# Patient Record
Sex: Female | Born: 1960 | Race: White | Hispanic: No | Marital: Married | Smoking: Never smoker
Health system: Southern US, Community
[De-identification: ages and names within clinical notes are randomized; demographics above are authoritative.]

## PROBLEM LIST (undated history)

## (undated) DIAGNOSIS — G43909 Migraine, unspecified, not intractable, without status migrainosus: Secondary | ICD-10-CM

## (undated) DIAGNOSIS — Z8619 Personal history of other infectious and parasitic diseases: Secondary | ICD-10-CM

## (undated) DIAGNOSIS — N951 Menopausal and female climacteric states: Secondary | ICD-10-CM

## (undated) HISTORY — PX: FOOT SURGERY: SHX648

## (undated) HISTORY — PX: BUNIONECTOMY: SHX129

## (undated) HISTORY — PX: REFRACTIVE SURGERY: SHX103

## (undated) HISTORY — PX: TRIGGER FINGER RELEASE: SHX641

---

## 2012-10-29 ENCOUNTER — Ambulatory Visit: Payer: Self-pay

## 2013-09-19 ENCOUNTER — Ambulatory Visit: Payer: Self-pay | Admitting: Family Medicine

## 2013-09-19 LAB — URINALYSIS, COMPLETE
BILIRUBIN, UR: NEGATIVE
Glucose,UR: NEGATIVE mg/dL (ref 0–75)
KETONE: NEGATIVE
Nitrite: NEGATIVE
Ph: 6 (ref 4.5–8.0)
SPECIFIC GRAVITY: 1.015 (ref 1.003–1.030)
SQUAMOUS EPITHELIAL: NONE SEEN

## 2013-09-21 LAB — URINE CULTURE

## 2013-11-11 ENCOUNTER — Ambulatory Visit: Payer: Self-pay | Admitting: Obstetrics and Gynecology

## 2014-06-26 ENCOUNTER — Ambulatory Visit
Admission: EM | Admit: 2014-06-26 | Discharge: 2014-06-26 | Disposition: A | Discharge status: Home / Self Care | Payer: BC Managed Care – PPO | Attending: Internal Medicine | Admitting: Internal Medicine

## 2014-06-26 DIAGNOSIS — Z79899 Other long term (current) drug therapy: Secondary | ICD-10-CM | POA: Diagnosis not present

## 2014-06-26 DIAGNOSIS — J029 Acute pharyngitis, unspecified: Secondary | ICD-10-CM | POA: Diagnosis present

## 2014-06-26 DIAGNOSIS — J039 Acute tonsillitis, unspecified: Secondary | ICD-10-CM | POA: Diagnosis not present

## 2014-06-26 HISTORY — DX: Migraine, unspecified, not intractable, without status migrainosus: G43.909

## 2014-06-26 LAB — RAPID STREP SCREEN (MED CTR MEBANE ONLY): Streptococcus, Group A Screen (Direct): NEGATIVE

## 2014-06-26 MED ORDER — PENICILLIN G BENZATHINE 1200000 UNIT/2ML IM SUSP
2.4000 10*6.[IU] | Freq: Once | INTRAMUSCULAR | Status: AC
  Administered 2014-06-26: 2.4 10*6.[IU] via INTRAMUSCULAR

## 2014-06-26 MED ORDER — FIRST-DUKES MOUTHWASH MT SUSP: 15.0000 mL | Freq: Three times a day (TID) | OROMUCOSAL | Status: AC

## 2014-06-26 NOTE — ED Notes (Signed)
Sore throat x 2 weeks. Pt reports that she had cold sx at onset. Cold sx improved, but now her throat is sore. Low grade fever, swollen lymph nodes. 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 Pt has noted pus pockets in throat.

## 2014-06-26 NOTE — Discharge Instructions (Signed)

## 2014-06-26 NOTE — ED Provider Notes (Addendum)
CSN: [id number]     Arrival date & time 06/26/14  1055 History   None    Chief Complaint  Patient presents with  . Sore Throat   (Consider location/radiation/quality/duration/timing/severity/associated sxs/prior Treatment) Patient is a 54 y.o. female presenting with pharyngitis. The history is provided by the patient.  Sore Throat This is a new problem. The current episode started more than 1 week ago. The problem occurs constantly. The problem has been gradually worsening. Associated symptoms include headaches. Pertinent negatives include no chest pain, no abdominal pain and no shortness of breath. The symptoms are aggravated by swallowing, eating, drinking and coughing. Nothing relieves the symptoms. She has tried water, food and rest for the symptoms. The treatment provided no relief.   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  Started with cold symptoms two weeks ago.  Seasonal allergies takes benadryl twice a week to control symptoms mild.  Worsening sore throat, headache, cough, postnasal drip. Past Medical History  Diagnosis Date  . Migraines    Past Surgical History  Procedure Laterality Date  . Trigger finger release      left ring finger  . Bunionectomy    . Foot surgery    . Refractive surgery     No family history on file. History  Substance Use Topics  . Smoking status: Never Smoker   . Smokeless tobacco: Not on file  . Alcohol Use: Yes   OB History    No data available     Review of Systems  Constitutional: Negative.   HENT: Positive for congestion, postnasal drip and sore throat.   Eyes: Negative.   Respiratory: Positive for cough. Negative for shortness of breath and wheezing.   Cardiovascular: Negative for chest pain.  Gastrointestinal: Negative for abdominal pain.  Genitourinary: Negative for difficulty urinating.  Musculoskeletal: Negative.   Skin: Negative.   Allergic/Immunologic:  Positive for environmental allergies. Negative for food allergies and immunocompromised state.  Neurological: Positive for headaches.  Hematological: Negative.   Psychiatric/Behavioral: Negative.     Allergies  Sulfa antibiotics  Home Medications   Prior to Admission medications   Medication Sig Start Date End Date Taking? Authorizing Provider  estradiol-norethindrone [facility](COMBIPATCH) 0.05-0.14 MG/DAY Place 1 patch onto the skin 2 (two) times a week.   Yes Historical Provider, MD  SUMAtriptan (IMITREX) 20 MG/ACT nasal spray Place 20 mg into the nose every 2 (two) hours as needed for migraine or headache. May repeat in 2 hours if headache persists or recurs.   Yes Historical Provider, MD  valACYclovir (VALTREX) 500 MG tablet Take 500 mg by mouth 2 (two) times daily.   Yes Historical Provider, MD  Diphenhyd-Hydrocort-Nystatin (FIRST-DUKES MOUTHWASH) SUSP Use as directed 15 mLs in the mouth or throat 3 (three) times daily. 06/26/14   Barbaraann Barthelina A Anorah Trias, NP   BP 112/74 mmHg  Pulse 81  Temp(Src) 99.1 F (37.3 C) (Oral)  Ht 5\' 2"  (1.575 m)  Wt 114 lb (51.71 kg)  BMI 20.85 kg/m2  SpO2 100% Physical Exam  Constitutional: She is oriented to person, place, and time. Vital signs are normal. She appears well-developed and well-nourished.  HENT:  Head: Normocephalic and atraumatic.  Right Ear: Hearing, tympanic membrane, external ear and ear canal normal.  Left Ear: Hearing, tympanic membrane, external ear and ear canal normal.  Nose: Nose normal.  Mouth/Throat: Uvula is midline and mucous membranes are normal. No trismus in the jaw. No uvula swelling. Oropharyngeal  exudate, posterior oropharyngeal edema and posterior oropharyngeal erythema present. No tonsillar abscesses.  Bilateral tonsils erythema with large patches exudate 2+/4; cobblestoning posterior pharynx, nares with erythema/edema/clear discharge turbinates; anterior cervical lymph nodes TTP left greater than right; bilateral TMs with air  fluid level clear; vasculature excoriated on TMs  Eyes: Conjunctivae, EOM and lids are normal. Pupils are equal, round, and reactive to light. Right eye exhibits no discharge. Left eye exhibits no discharge. No scleral icterus.  Neck: Trachea normal and normal range of motion. Neck supple. No JVD present. No tracheal deviation present. No thyromegaly present.  Cardiovascular: Normal rate, regular rhythm, normal heart sounds and intact distal pulses.  Exam reveals no gallop and no friction rub.   No murmur heard. Pulmonary/Chest: Effort normal and breath sounds normal. No respiratory distress. She has no wheezes. She has no rales. She exhibits no tenderness.  Musculoskeletal: Normal range of motion. She exhibits no edema or tenderness.  Lymphadenopathy:    She has no cervical adenopathy.  Neurological: She is alert and oriented to person, place, and time.  Skin: Skin is warm, dry and intact. No rash noted. No erythema. No pallor.  Psychiatric: She has a normal mood and affect. Her speech is normal and behavior is normal. Judgment and thought content normal. Cognition and memory are normal.  Nursing note and vitals reviewed.   ED Course  Procedures (including critical care time) Labs Review Labs Reviewed  RAPID STREP SCREEN  CULTURE, GROUP A STREP [facility])    Imaging Review No results found.   School/work excuse note given to patient for 24 hours.  Usually no specific medical treatment is needed if a virus is causing the sore throat.  The throat most often gets better on its own within 5 to 7 days.  Antibiotic medicine does not cure viral pharyngitis.   For acute pharyngitis caused by bacteria, your healthcare provider will prescribe an antibiotic.  Marland Kitchen. Do not smoke.  Marland Kitchen. Avoid secondhand smoke and other air pollutants.  . Use a cool mist humidifier to add moisture to the air.  . Get plenty of rest.  . You may want to rest your throat by talking less and eating a diet that is mostly liquid or  soft for a day or two.   Marland Kitchen. Nonprescription throat lozenges and mouthwashes should help relieve the soreness.   . Gargling with warm saltwater and drinking warm liquids may help.  (You can make a saltwater solution by adding 1/4 teaspoon of salt to 8 ounces, or 240 mL, of warm water.)  . A nonprescription pain reliever such as aspirin, acetaminophen, or ibuprofen may ease general aches and pains.   FOLLOW UP with clinic provider if no improvements in the next 7-10 days.  Patient verbalized understanding of instructions and agreed with plan of care. P2:  Hand washing and diet.  MDM   1. Tonsillitis with exudate     29 Jun 2014 at 0753  Patient contacted via telephone notified throat culture grew Steptococcus group C.  Patient stated her symptoms improved approximately 30 hours after bicillian LA injection, she is back to work and feeling better.  Patient verbalized understanding of information and had no further questions at this time.  Barbaraann Barthelina A [facility], NP 06/26/14 1440  Barbaraann Barthelina A Marshall Roehrich, NP 06/29/14 [phone]

## 2014-06-28 LAB — CULTURE, GROUP A STREP (THRC)

## 2014-10-27 ENCOUNTER — Other Ambulatory Visit: Payer: Self-pay | Admitting: Obstetrics and Gynecology

## 2014-10-27 DIAGNOSIS — Z1231 Encounter for screening mammogram for malignant neoplasm of breast: Secondary | ICD-10-CM

## 2014-11-13 ENCOUNTER — Ambulatory Visit
Admission: EM | Admit: 2014-11-13 | Discharge: 2014-11-13 | Disposition: A | Discharge status: Home / Self Care | Payer: BC Managed Care – PPO | Attending: Internal Medicine | Admitting: Internal Medicine

## 2014-11-13 ENCOUNTER — Encounter: Payer: Self-pay | Admitting: Emergency Medicine

## 2014-11-13 DIAGNOSIS — R1031 Right lower quadrant pain: Secondary | ICD-10-CM | POA: Diagnosis not present

## 2014-11-13 NOTE — ED Provider Notes (Signed)
CSN: [id number]     Arrival date & time 11/13/14  1418 History   First MD Initiated Contact with Patient 11/13/14 1532     Chief Complaint  Patient presents with  . Abdominal Pain   HPI  Patient is a 54 year old lady who had the sudden onset of diffuse abdominal pain.12 hours ago, severe. It was sharp initially, now it's more of a dull ache, fairly constant. Equivocal radiation to the right side of the back. Pain is becoming more severe in the right lower quadrant. Position changes, moving from sitting to standing, lying back, getting out of the car, riding in the car, all seem to make pain worse. She's had intermittent nausea and a couple episodes of dry heaving. Did a soft bowel movement earlier today. No dysuria, no urinary frequency. Recently she had some difficulty with postcoital bleeding, and had a pelvic ultrasound that demonstrated a thin endometrial stripe. No hematuria. No fever, has had some chills.  Past Medical History  Diagnosis Date  . Migraines    Past Surgical History  Procedure Laterality Date  . Trigger finger release      left ring finger  . Bunionectomy    . Foot surgery    . Refractive surgery     History reviewed. No pertinent family history. Social History  Substance Use Topics  . Smoking status: Never Smoker   . Smokeless tobacco: None  . Alcohol Use: Yes   OB History    No data available     Review of Systems  All other systems reviewed and are negative.   Allergies  Sulfa antibiotics  Home Medications   Prior to Admission medications   Medication Sig Start Date End Date Taking? Authorizing Provider  Diphenhyd-Hydrocort-Nystatin (FIRST-DUKES MOUTHWASH) SUSP Use as directed 15 mLs in the mouth or throat 3 (three) times daily. 06/26/14   Barbaraann Barthel, NP  estradiol-norethindrone (COMBIPATCH) 0.05-0.14 MG/DAY Place 1 patch onto the skin 2 (two) times a week.    Historical Provider, MD  SUMAtriptan (IMITREX) 20 MG/ACT nasal spray Place 20 mg into  the nose every 2 (two) hours as needed for migraine or headache. May repeat in 2 hours if headache persists or recurs.    Historical Provider, MD  valACYclovir (VALTREX) 500 MG tablet Take 500 mg by mouth 2 (two) times daily.    Historical Provider, MD   Meds Ordered and Administered this Visit  Medications - No data to display  BP 112/59 mmHg  Pulse 105  Temp(Src) 98.4 F (36.9 C) (Oral)  Resp 16  Ht  (1.575 m)  Wt 112 lb (50.803 kg)  BMI 20.48 kg/m2  SpO2 100% No data found.   Physical Exam  Constitutional: She is oriented to person, place, and time. No distress.  Alert, nicely groomed  HENT:  Head: Atraumatic.  Eyes:  Conjugate gaze, no eye redness/drainage  Neck: Neck supple.  Cardiovascular: Normal rate and regular rhythm.   Pulmonary/Chest: No respiratory distress. She has no wheezes. She has no rales.  Lungs clear, symmetric breath sounds  Abdominal:  Abdomen is soft, slightly distended, with marked tenderness in the right lower quadrant, mild guarding. Equivocal peritoneal signs, is very painful for her to lie down and then to sit up.  Musculoskeletal: Normal range of motion.  No leg swelling  Neurological: She is alert and oriented to person, place, and time.  Skin: Skin is warm and dry.  Pink. No cyanosis  Nursing note and vitals reviewed.   ED  Course  Procedures (including critical care time) None   MDM   1. Abdominal pain, acute, right lower quadrant    Clinically, concern for appendicitis. Patient is referred to the emergency room for further evaluation. She typically receives care in the [facility] system, and is headed to the [facility] ER.    Eustace Moore, MD 11/13/14 [phone]

## 2014-11-13 NOTE — ED Notes (Signed)
Patient states yesterday she developed a stomach ache, on the right lower side, light headedness, fever and nausea

## 2014-11-22 ENCOUNTER — Ambulatory Visit: Payer: BC Managed Care – PPO

## 2014-12-29 ENCOUNTER — Ambulatory Visit
Admission: RE | Admit: 2014-12-29 | Discharge: 2014-12-29 | Disposition: A | Discharge status: Home / Self Care | Payer: BC Managed Care – PPO | Source: Ambulatory Visit | Attending: Obstetrics and Gynecology | Admitting: Obstetrics and Gynecology

## 2014-12-29 DIAGNOSIS — Z1231 Encounter for screening mammogram for malignant neoplasm of breast: Secondary | ICD-10-CM | POA: Insufficient documentation

## 2015-09-06 ENCOUNTER — Encounter: Payer: Self-pay | Admitting: Emergency Medicine

## 2015-09-06 ENCOUNTER — Ambulatory Visit
Admission: EM | Admit: 2015-09-06 | Discharge: 2015-09-06 | Disposition: A | Discharge status: Home / Self Care | Payer: BC Managed Care – PPO | Attending: Family Medicine | Admitting: Family Medicine

## 2015-09-06 DIAGNOSIS — J329 Chronic sinusitis, unspecified: Secondary | ICD-10-CM

## 2015-09-06 DIAGNOSIS — R05 Cough: Secondary | ICD-10-CM | POA: Diagnosis not present

## 2015-09-06 DIAGNOSIS — R0982 Postnasal drip: Secondary | ICD-10-CM

## 2015-09-06 DIAGNOSIS — R059 Cough, unspecified: Secondary | ICD-10-CM

## 2015-09-06 HISTORY — DX: Personal history of other infectious and parasitic diseases: Z86.19

## 2015-09-06 HISTORY — DX: Menopausal and female climacteric states: N95.1

## 2015-09-06 MED ORDER — GUAIFENESIN-CODEINE 100-10 MG/5ML PO SOLN: ORAL | Status: DC

## 2015-09-06 NOTE — ED Notes (Signed)
Pt reports persistent cough started a week ago, mostly at night, but now having coughing fits and almost feels like she was going to vomit and difficulty sleeping.

## 2015-09-06 NOTE — ED Notes (Signed)
Also post-nasal drip. Cough is dry, not really coughing anything up, hoarse voice.

## 2015-09-06 NOTE — ED Provider Notes (Signed)
CSN: [id number]     Arrival date & time 09/06/15  4098 History   First MD Initiated Contact with Patient 09/06/15 [phone]     Chief Complaint  Patient presents with  . Cough   (Consider location/radiation/quality/duration/timing/severity/associated sxs/prior Treatment) Patient is a 55 y.o. female presenting with cough. The history is provided by the patient.  Cough Cough characteristics:  Non-productive Severity:  Moderate Onset quality:  Sudden Duration:  1 week Timing:  Constant Progression:  Worsening Chronicity:  New Smoker: no   Context comment:  Unknown Relieved by:  Nothing Associated symptoms: rhinorrhea   Associated symptoms: no chest pain, no chills, no diaphoresis, no ear fullness, no ear pain, no eye discharge, no fever, no headaches, no myalgias, no rash, no shortness of breath, no sinus congestion, no sore throat, no weight loss and no wheezing   Risk factors: no chemical exposure, no recent infection and no recent travel     Past Medical History  Diagnosis Date  . Migraines   . Menopausal symptoms   . History of herpes zoster virus    Past Surgical History  Procedure Laterality Date  . Trigger finger release      left ring finger  . Bunionectomy    . Foot surgery    . Refractive surgery     History reviewed. No pertinent family history. Social History  Substance Use Topics  . Smoking status: Never Smoker   . Smokeless tobacco: None  . Alcohol Use: Yes   OB History    No data available     Review of Systems  Constitutional: Negative for fever, chills, weight loss and diaphoresis.  HENT: Positive for rhinorrhea. Negative for ear pain and sore throat.   Eyes: Negative for discharge.  Respiratory: Positive for cough. Negative for shortness of breath and wheezing.   Cardiovascular: Negative for chest pain.  Musculoskeletal: Negative for myalgias.  Skin: Negative for rash.  Neurological: Negative for headaches.    Allergies  Sulfa  antibiotics  Home Medications   Prior to Admission medications   Medication Sig Start Date End Date Taking? Authorizing Provider  Calcium Carbonate-Vitamin D (CALCIUM 600+D PO) Take by mouth. With magnesium   Yes Historical Provider, MD  glucosamine-chondroitin 500-400 MG tablet Take 1 tablet by mouth 3 (three) times daily.   Yes Historical Provider, MD  Multiple Vitamin (MULTIVITAMIN) tablet Take 1 tablet by mouth daily.   Yes Historical Provider, MD  progesterone (PROMETRIUM) 100 MG capsule Take 100 mg by mouth daily.   Yes Historical Provider, MD  Diphenhyd-Hydrocort-Nystatin (FIRST-DUKES MOUTHWASH) SUSP Use as directed 15 mLs in the mouth or throat 3 (three) times daily. 06/26/14   Barbaraann Barthel, NP  estradiol-norethindrone (COMBIPATCH) 0.05-0.14 MG/DAY Place 1 patch onto the skin 2 (two) times a week.    Historical Provider, MD  guaiFENesin-codeine 100-10 MG/5ML syrup 5-10 ml po qhs prn 09/06/15   Payton Mccallum, MD  SUMAtriptan (IMITREX) 20 MG/ACT nasal spray Place 20 mg into the nose every 2 (two) hours as needed for migraine or headache. May repeat in 2 hours if headache persists or recurs.    Historical Provider, MD  valACYclovir (VALTREX) 500 MG tablet Take 500 mg by mouth 2 (two) times daily.    Historical Provider, MD   Meds Ordered and Administered this Visit  Medications - No data to display  BP 104/69 mmHg  Pulse 74  Temp(Src) 98.3 F (36.8 C) (Oral)  Resp 18  Ht 5' 2.5" (1.588 m)  Wt 111  lb (50.349 kg)  BMI 19.97 kg/m2  SpO2 100% No data found.   Physical Exam  Constitutional: She appears well-developed and well-nourished. No distress.  HENT:  Head: Normocephalic and atraumatic.  Right Ear: Tympanic membrane, external ear and ear canal normal.  Left Ear: Tympanic membrane, external ear and ear canal normal.  Nose: No nose lacerations, sinus tenderness, nasal deformity, septal deviation or nasal septal hematoma. No epistaxis.  No foreign bodies.  Mouth/Throat:  Uvula is midline, oropharynx is clear and moist and mucous membranes are normal. No oropharyngeal exudate.  Eyes: Conjunctivae and EOM are normal. Pupils are equal, round, and reactive to light. Right eye exhibits no discharge. Left eye exhibits no discharge. No scleral icterus.  Neck: Normal range of motion. Neck supple. No thyromegaly present.  Cardiovascular: Normal rate, regular rhythm and normal heart sounds.   Pulmonary/Chest: Effort normal and breath sounds normal. No respiratory distress. She has no wheezes. She has no rales.  Lymphadenopathy:    She has no cervical adenopathy.  Skin: She is not diaphoretic.  Nursing note and vitals reviewed.   ED Course  Procedures (including critical care time)  Labs Review Labs Reviewed - No data to display  Imaging Review No results found.   Visual Acuity Review  Right Eye Distance:   Left Eye Distance:   Bilateral Distance:    Right Eye Near:   Left Eye Near:    Bilateral Near:         MDM   1. Cough   2. Post-nasal drainage    Discharge Medication List as of 09/06/2015  9:18 AM    START taking these medications   Details  guaiFENesin-codeine 100-10 MG/5ML syrup 5-10 ml po qhs prn, Print       1.  diagnosis reviewed with patient 2. rx as per orders above; reviewed possible side effects, interactions, risks and benefits  3. Recommend supportive treatment with otc flonase, decongestants/antihistamines 4. Follow-up prn if symptoms worsen or don't improve    Payton Mccallumrlando Ariane Ditullio, MD 09/06/15 1138

## 2015-10-25 ENCOUNTER — Other Ambulatory Visit: Payer: Self-pay | Admitting: Obstetrics and Gynecology

## 2015-10-25 DIAGNOSIS — Z1231 Encounter for screening mammogram for malignant neoplasm of breast: Secondary | ICD-10-CM

## 2016-01-02 ENCOUNTER — Ambulatory Visit
Admission: RE | Admit: 2016-01-02 | Discharge: 2016-01-02 | Disposition: A | Discharge status: Home / Self Care | Payer: BC Managed Care – PPO | Source: Ambulatory Visit | Attending: Obstetrics and Gynecology | Admitting: Obstetrics and Gynecology

## 2016-01-02 ENCOUNTER — Other Ambulatory Visit: Payer: Self-pay | Admitting: Obstetrics and Gynecology

## 2016-01-02 ENCOUNTER — Encounter (INDEPENDENT_AMBULATORY_CARE_PROVIDER_SITE_OTHER): Payer: Self-pay

## 2016-01-02 DIAGNOSIS — Z1231 Encounter for screening mammogram for malignant neoplasm of breast: Secondary | ICD-10-CM

## 2016-08-18 HISTORY — PX: ABDOMINAL HYSTERECTOMY: SHX81

## 2016-11-22 ENCOUNTER — Other Ambulatory Visit: Payer: Self-pay | Admitting: Obstetrics and Gynecology

## 2016-11-22 DIAGNOSIS — Z1231 Encounter for screening mammogram for malignant neoplasm of breast: Secondary | ICD-10-CM

## 2017-01-14 ENCOUNTER — Ambulatory Visit
Admission: RE | Admit: 2017-01-14 | Discharge: 2017-01-14 | Disposition: A | Discharge status: Home / Self Care | Payer: BC Managed Care – PPO | Source: Ambulatory Visit | Attending: Obstetrics and Gynecology | Admitting: Obstetrics and Gynecology

## 2017-01-14 DIAGNOSIS — Z1231 Encounter for screening mammogram for malignant neoplasm of breast: Secondary | ICD-10-CM | POA: Diagnosis present

## 2017-08-20 ENCOUNTER — Other Ambulatory Visit: Payer: Self-pay

## 2017-08-20 ENCOUNTER — Ambulatory Visit
Admission: EM | Admit: 2017-08-20 | Discharge: 2017-08-20 | Disposition: A | Discharge status: Home / Self Care | Payer: BC Managed Care – PPO | Attending: Family Medicine | Admitting: Family Medicine

## 2017-08-20 DIAGNOSIS — J069 Acute upper respiratory infection, unspecified: Secondary | ICD-10-CM | POA: Diagnosis not present

## 2017-08-20 DIAGNOSIS — B9789 Other viral agents as the cause of diseases classified elsewhere: Secondary | ICD-10-CM | POA: Diagnosis not present

## 2017-08-20 MED ORDER — GUAIFENESIN-CODEINE 100-10 MG/5ML PO SOLN: ORAL | 0 refills | Status: AC

## 2017-08-20 NOTE — ED Triage Notes (Signed)
Pt states she caught a URI from her grandchildren. Has been having coughing jags and would like a refill on her Virtussin.

## 2017-08-20 NOTE — ED Provider Notes (Signed)
[facility]    CSN: [id number] Arrival date & time: 08/20/17  1140     History   Chief Complaint Chief Complaint  Patient presents with  . Cough    HPI Lori Fowler is a 57 y.o. female.   The history is provided by the patient.  Cough  Associated symptoms: rhinorrhea   Associated symptoms: no wheezing   URI  Presenting symptoms: cough and rhinorrhea   Severity:  Moderate Onset quality:  Sudden Duration:  5 days Timing:  Constant Progression:  Worsening Chronicity:  New Relieved by:  Prescription medications Associated symptoms: no sinus pain and no wheezing   Risk factors: sick contacts   Risk factors: not elderly, no chronic cardiac disease, no chronic kidney disease, no chronic respiratory disease, no diabetes mellitus, no immunosuppression, no recent illness and no recent travel     Past Medical History:  Diagnosis Date  . History of herpes zoster virus   . Menopausal symptoms   . Migraines     There are no active problems to display for this patient.   Past Surgical History:  Procedure Laterality Date  . ABDOMINAL HYSTERECTOMY  08/2016  . BUNIONECTOMY    . FOOT SURGERY    . REFRACTIVE SURGERY    . TRIGGER FINGER RELEASE     left ring finger    OB History   None      Home Medications    Prior to Admission medications   Medication Sig Start Date End Date Taking? Authorizing Provider  Calcium Carbonate-Vitamin D (CALCIUM 600+D PO) Take by mouth. With magnesium    [provider]  Diphenhyd-Hydrocort-Nystatin (FIRST-DUKES MOUTHWASH) SUSP Use as directed 15 mLs in the mouth or throat 3 (three) times daily. 06/26/14   Betancourt, Jarold Songina A, NP  estradiol-norethindrone (COMBIPATCH) 0.05-0.14 MG/DAY Place 1 patch onto the skin 2 (two) times a week.    [provider]  glucosamine-chondroitin 500-400 MG tablet Take 1 tablet by mouth 3 (three) times daily.    [provider]  guaiFENesin-codeine 100-10 MG/5ML  syrup 5-10 ml po qhs prn 08/20/17   Payton Mccallumonty, Heran Campau, MD  Multiple Vitamin (MULTIVITAMIN) tablet Take 1 tablet by mouth daily.    [provider]  progesterone (PROMETRIUM) 100 MG capsule Take 100 mg by mouth daily.    [provider]  SUMAtriptan (IMITREX) 20 MG/ACT nasal spray Place 20 mg into the nose every 2 (two) hours as needed for migraine or headache. May repeat in 2 hours if headache persists or recurs.    [provider]  valACYclovir (VALTREX) 500 MG tablet Take 500 mg by mouth 2 (two) times daily.    [provider]    Family History Family History  Problem Relation Age of Onset  . Breast cancer Neg Hx     Social History Social History   Tobacco Use  . Smoking status: Never Smoker  . Smokeless tobacco: Never Used  Substance Use Topics  . Alcohol use: Yes    Comment: social  . Drug use: Never     Allergies   Sulfa antibiotics   Review of Systems Review of Systems  HENT: Positive for rhinorrhea. Negative for sinus pain.   Respiratory: Positive for cough. Negative for wheezing.      Physical Exam Triage Vital Signs ED Triage Vitals  Enc Vitals Group     BP 08/20/17 1149 112/69     Pulse Rate 08/20/17 1149 72     Resp 08/20/17 1149 16  Temp 08/20/17 1149 98.5 F (36.9 C)     Temp Source 08/20/17 1149 Oral     SpO2 08/20/17 1149 100 %     Weight 08/20/17 1148 115 lb (52.2 kg)     Height 08/20/17 1148 5\' 3"  (1.6 m)     Head Circumference --      Peak Flow --      Pain Score 08/20/17 1147 0     Pain Loc --      Pain Edu? --      Excl. in GC? --    No data found.  Updated Vital Signs BP 112/69 (BP Location: Right Arm)   Pulse 72   Temp 98.5 F (36.9 C) (Oral)   Resp 16   Ht 5\' 3"  (1.6 m)   Wt 115 lb (52.2 kg)   SpO2 100%   BMI 20.37 kg/m   Visual Acuity Right Eye Distance:   Left Eye Distance:   Bilateral Distance:    Right Eye Near:   Left Eye Near:    Bilateral Near:     Physical Exam    Constitutional: She appears well-developed and well-nourished.  Non-toxic appearance. She does not have a sickly appearance. She does not appear ill. No distress.  HENT:  Head: Normocephalic and atraumatic.  Nose: Rhinorrhea present. No nose lacerations, sinus tenderness, nasal deformity, septal deviation or nasal septal hematoma. No epistaxis.  No foreign bodies.  Mouth/Throat: Uvula is midline, oropharynx is clear and moist and mucous membranes are normal. No oropharyngeal exudate, posterior oropharyngeal edema, posterior oropharyngeal erythema or tonsillar abscesses. No tonsillar exudate.  Neck: Normal range of motion. Neck supple. No thyromegaly present.  Cardiovascular: Normal rate, regular rhythm and normal heart sounds.  Pulmonary/Chest: Effort normal and breath sounds normal. No stridor. No respiratory distress. She has no wheezes. She has no rales.  Lymphadenopathy:    She has no cervical adenopathy.  Skin: She is not diaphoretic.  Nursing note and vitals reviewed.    UC Treatments / Results  Labs (all labs ordered are listed, but only abnormal results are displayed) Labs Reviewed - No data to display  EKG None  Radiology No results found.  Procedures Procedures (including critical care time)  Medications Ordered in UC Medications - No data to display  Initial Impression / Assessment and Plan / UC Course  I have reviewed the triage vital signs and the nursing notes.  Pertinent labs & imaging results that were available during my care of the patient were reviewed by me and considered in my medical decision making (see chart for details).      Final Clinical Impressions(s) / UC Diagnoses   Final diagnoses:  Viral URI with cough   Discharge Instructions   None    ED Prescriptions    Medication Sig Dispense Auth. Provider   guaiFENesin-codeine 100-10 MG/5ML syrup 5-10 ml po qhs prn 100 mL Payton Mccallum, MD      1. diagnosis reviewed with patient 2. rx  as per orders above; reviewed possible side effects, interactions, risks and benefits  3. Recommend supportive treatment with rest, fluids 4. Follow-up prn if symptoms worsen or don't improve Controlled Substance Prescriptions [place] Controlled Substance Registry consulted? Not Applicable   Payton Mccallum, MD 08/20/17 1214

## 2017-12-09 ENCOUNTER — Other Ambulatory Visit: Payer: Self-pay | Admitting: Obstetrics and Gynecology

## 2017-12-09 DIAGNOSIS — Z1231 Encounter for screening mammogram for malignant neoplasm of breast: Secondary | ICD-10-CM

## 2017-12-22 ENCOUNTER — Other Ambulatory Visit: Payer: Self-pay | Admitting: Obstetrics and Gynecology

## 2017-12-22 DIAGNOSIS — Z8262 Family history of osteoporosis: Secondary | ICD-10-CM

## 2018-01-28 ENCOUNTER — Inpatient Hospital Stay: Admission: RE | Admit: 2018-01-28 | Payer: BC Managed Care – PPO | Source: Ambulatory Visit

## 2018-03-09 ENCOUNTER — Ambulatory Visit
Admission: RE | Admit: 2018-03-09 | Discharge: 2018-03-09 | Disposition: A | Discharge status: Home / Self Care | Payer: BC Managed Care – PPO | Source: Ambulatory Visit | Attending: Obstetrics and Gynecology | Admitting: Obstetrics and Gynecology

## 2018-03-09 ENCOUNTER — Encounter (INDEPENDENT_AMBULATORY_CARE_PROVIDER_SITE_OTHER): Payer: Self-pay

## 2018-03-09 DIAGNOSIS — Z8262 Family history of osteoporosis: Secondary | ICD-10-CM

## 2018-03-09 DIAGNOSIS — Z1231 Encounter for screening mammogram for malignant neoplasm of breast: Secondary | ICD-10-CM | POA: Insufficient documentation

## 2019-01-25 ENCOUNTER — Other Ambulatory Visit: Payer: Self-pay | Admitting: Obstetrics and Gynecology

## 2019-01-25 DIAGNOSIS — Z1231 Encounter for screening mammogram for malignant neoplasm of breast: Secondary | ICD-10-CM

## 2019-03-11 ENCOUNTER — Other Ambulatory Visit: Payer: Self-pay

## 2019-03-11 ENCOUNTER — Ambulatory Visit
Admission: RE | Admit: 2019-03-11 | Discharge: 2019-03-11 | Disposition: A | Discharge status: Home / Self Care | Payer: BC Managed Care – PPO | Source: Ambulatory Visit | Attending: Obstetrics and Gynecology | Admitting: Obstetrics and Gynecology

## 2019-03-11 DIAGNOSIS — Z1231 Encounter for screening mammogram for malignant neoplasm of breast: Secondary | ICD-10-CM

## 2020-01-11 ENCOUNTER — Other Ambulatory Visit: Payer: Self-pay | Admitting: Family

## 2020-01-11 DIAGNOSIS — Z1231 Encounter for screening mammogram for malignant neoplasm of breast: Secondary | ICD-10-CM

## 2020-03-22 ENCOUNTER — Ambulatory Visit: Payer: BC Managed Care – PPO

## 2020-03-29 ENCOUNTER — Other Ambulatory Visit: Payer: Self-pay

## 2020-03-29 ENCOUNTER — Ambulatory Visit
Admission: RE | Admit: 2020-03-29 | Discharge: 2020-03-29 | Disposition: A | Discharge status: Home / Self Care | Payer: BC Managed Care – PPO | Source: Ambulatory Visit | Attending: Family | Admitting: Family

## 2020-03-29 DIAGNOSIS — Z1231 Encounter for screening mammogram for malignant neoplasm of breast: Secondary | ICD-10-CM | POA: Diagnosis present

## 2020-04-03 ENCOUNTER — Other Ambulatory Visit: Payer: Self-pay | Admitting: Family

## 2020-04-05 ENCOUNTER — Other Ambulatory Visit: Payer: Self-pay | Admitting: Family

## 2020-04-05 DIAGNOSIS — R928 Other abnormal and inconclusive findings on diagnostic imaging of breast: Secondary | ICD-10-CM

## 2020-04-05 DIAGNOSIS — R921 Mammographic calcification found on diagnostic imaging of breast: Secondary | ICD-10-CM

## 2020-04-14 ENCOUNTER — Other Ambulatory Visit: Payer: Self-pay

## 2020-04-14 ENCOUNTER — Ambulatory Visit
Admission: RE | Admit: 2020-04-14 | Discharge: 2020-04-14 | Disposition: A | Discharge status: Home / Self Care | Payer: BC Managed Care – PPO | Source: Ambulatory Visit | Attending: Family | Admitting: Family

## 2020-04-14 DIAGNOSIS — R928 Other abnormal and inconclusive findings on diagnostic imaging of breast: Secondary | ICD-10-CM | POA: Insufficient documentation

## 2020-04-14 DIAGNOSIS — R921 Mammographic calcification found on diagnostic imaging of breast: Secondary | ICD-10-CM | POA: Insufficient documentation

## 2021-02-27 ENCOUNTER — Other Ambulatory Visit: Payer: Self-pay | Admitting: Family

## 2021-02-27 DIAGNOSIS — Z1231 Encounter for screening mammogram for malignant neoplasm of breast: Secondary | ICD-10-CM

## 2021-03-28 LAB — COLOGUARD: COLOGUARD: NEGATIVE

## 2021-05-21 ENCOUNTER — Ambulatory Visit
Admission: RE | Admit: 2021-05-21 | Discharge: 2021-05-21 | Disposition: A | Discharge status: Home / Self Care | Payer: BC Managed Care – PPO | Source: Ambulatory Visit | Attending: Family | Admitting: Family

## 2021-05-21 DIAGNOSIS — Z1231 Encounter for screening mammogram for malignant neoplasm of breast: Secondary | ICD-10-CM | POA: Diagnosis not present

## 2021-05-31 ENCOUNTER — Other Ambulatory Visit: Payer: Self-pay | Admitting: Family

## 2021-05-31 DIAGNOSIS — N63 Unspecified lump in unspecified breast: Secondary | ICD-10-CM

## 2021-05-31 DIAGNOSIS — R928 Other abnormal and inconclusive findings on diagnostic imaging of breast: Secondary | ICD-10-CM

## 2021-06-15 ENCOUNTER — Ambulatory Visit
Admission: RE | Admit: 2021-06-15 | Discharge: 2021-06-15 | Disposition: A | Discharge status: Home / Self Care | Payer: BC Managed Care – PPO | Source: Ambulatory Visit | Attending: Family | Admitting: Family

## 2021-06-15 DIAGNOSIS — N63 Unspecified lump in unspecified breast: Secondary | ICD-10-CM

## 2021-06-15 DIAGNOSIS — R928 Other abnormal and inconclusive findings on diagnostic imaging of breast: Secondary | ICD-10-CM

## 2022-02-12 ENCOUNTER — Other Ambulatory Visit: Payer: Self-pay | Admitting: Obstetrics and Gynecology

## 2022-02-12 DIAGNOSIS — Z1231 Encounter for screening mammogram for malignant neoplasm of breast: Secondary | ICD-10-CM

## 2022-05-07 ENCOUNTER — Other Ambulatory Visit: Payer: Self-pay | Admitting: Family

## 2022-05-07 DIAGNOSIS — Z1231 Encounter for screening mammogram for malignant neoplasm of breast: Secondary | ICD-10-CM

## 2022-06-11 ENCOUNTER — Ambulatory Visit
Admission: RE | Admit: 2022-06-11 | Discharge: 2022-06-11 | Disposition: A | Discharge status: Home / Self Care | Payer: BC Managed Care – PPO | Source: Ambulatory Visit | Attending: Family | Admitting: Family

## 2022-06-11 DIAGNOSIS — Z1231 Encounter for screening mammogram for malignant neoplasm of breast: Secondary | ICD-10-CM | POA: Diagnosis not present

## 2022-06-19 ENCOUNTER — Other Ambulatory Visit: Payer: Self-pay | Admitting: Family

## 2022-06-19 DIAGNOSIS — N6489 Other specified disorders of breast: Secondary | ICD-10-CM

## 2022-06-19 DIAGNOSIS — R928 Other abnormal and inconclusive findings on diagnostic imaging of breast: Secondary | ICD-10-CM

## 2022-06-20 ENCOUNTER — Encounter: Payer: Self-pay | Admitting: Family

## 2022-07-11 ENCOUNTER — Ambulatory Visit
Admission: RE | Admit: 2022-07-11 | Discharge: 2022-07-11 | Disposition: A | Discharge status: Home / Self Care | Payer: BC Managed Care – PPO | Source: Ambulatory Visit | Attending: Family | Admitting: Family

## 2022-07-11 DIAGNOSIS — N6489 Other specified disorders of breast: Secondary | ICD-10-CM | POA: Insufficient documentation

## 2022-07-11 DIAGNOSIS — R928 Other abnormal and inconclusive findings on diagnostic imaging of breast: Secondary | ICD-10-CM

## 2022-07-12 ENCOUNTER — Encounter: Payer: Self-pay | Admitting: Family

## 2022-07-16 ENCOUNTER — Other Ambulatory Visit: Payer: Self-pay | Admitting: Family

## 2022-07-16 ENCOUNTER — Encounter: Payer: Self-pay | Admitting: Family

## 2022-07-16 DIAGNOSIS — R928 Other abnormal and inconclusive findings on diagnostic imaging of breast: Secondary | ICD-10-CM

## 2022-07-16 DIAGNOSIS — N6489 Other specified disorders of breast: Secondary | ICD-10-CM

## 2022-08-15 ENCOUNTER — Ambulatory Visit
Admission: RE | Admit: 2022-08-15 | Discharge: 2022-08-15 | Disposition: A | Discharge status: Home / Self Care | Payer: BC Managed Care – PPO | Source: Ambulatory Visit | Attending: Family | Admitting: Family

## 2022-08-15 DIAGNOSIS — R928 Other abnormal and inconclusive findings on diagnostic imaging of breast: Secondary | ICD-10-CM | POA: Insufficient documentation

## 2022-08-15 DIAGNOSIS — N6489 Other specified disorders of breast: Secondary | ICD-10-CM | POA: Insufficient documentation

## 2022-08-15 HISTORY — PX: BREAST BIOPSY: SHX20

## 2022-08-15 MED ORDER — LIDOCAINE-EPINEPHRINE 1 %-1:100000 IJ SOLN
20.0000 mL | Freq: Once | INTRAMUSCULAR | Status: AC
  Administered 2022-08-15: 20 mL
  Filled 2022-08-15: qty 20

## 2022-08-15 MED ORDER — LIDOCAINE 1 % OPTIME INJ - NO CHARGE
5.0000 mL | Freq: Once | INTRAMUSCULAR | Status: AC
  Administered 2022-08-15: 5 mL
  Filled 2022-08-15: qty 6

## 2022-08-15 MED ORDER — LIDOCAINE-EPINEPHRINE 1 %-1:100000 IJ SOLN
10.0000 mL | Freq: Once | INTRAMUSCULAR | Status: AC
  Administered 2022-08-15: 10 mL
  Filled 2022-08-15: qty 10

## 2022-09-03 ENCOUNTER — Other Ambulatory Visit: Payer: Self-pay | Admitting: Family

## 2022-09-03 DIAGNOSIS — N63 Unspecified lump in unspecified breast: Secondary | ICD-10-CM

## 2022-10-10 ENCOUNTER — Other Ambulatory Visit: Payer: Self-pay | Admitting: Family

## 2022-10-10 DIAGNOSIS — N6321 Unspecified lump in the left breast, upper outer quadrant: Secondary | ICD-10-CM

## 2022-10-15 ENCOUNTER — Ambulatory Visit
Admission: RE | Admit: 2022-10-15 | Discharge: 2022-10-15 | Disposition: A | Discharge status: Home / Self Care | Payer: BC Managed Care – PPO | Source: Ambulatory Visit | Attending: Family | Admitting: Family

## 2022-10-15 DIAGNOSIS — N6321 Unspecified lump in the left breast, upper outer quadrant: Secondary | ICD-10-CM | POA: Insufficient documentation

## 2022-10-15 MED ORDER — GADOBUTROL 1 MMOL/ML IV SOLN
5.0000 mL | Freq: Once | INTRAVENOUS | Status: AC | PRN
  Administered 2022-10-15: 5 mL via INTRAVENOUS

## 2023-02-17 ENCOUNTER — Ambulatory Visit
Admission: RE | Admit: 2023-02-17 | Discharge: 2023-02-17 | Disposition: A | Discharge status: Home / Self Care | Payer: BC Managed Care – PPO | Source: Ambulatory Visit | Attending: Family | Admitting: Family

## 2023-02-17 ENCOUNTER — Ambulatory Visit
Admission: RE | Admit: 2023-02-17 | Discharge: 2023-02-17 | Disposition: A | Discharge status: Home / Self Care | Payer: BC Managed Care – PPO | Source: Ambulatory Visit | Attending: Family

## 2023-02-17 ENCOUNTER — Other Ambulatory Visit: Payer: BC Managed Care – PPO

## 2023-02-17 DIAGNOSIS — N63 Unspecified lump in unspecified breast: Secondary | ICD-10-CM | POA: Insufficient documentation

## 2023-06-11 ENCOUNTER — Other Ambulatory Visit: Payer: Self-pay | Admitting: Obstetrics and Gynecology

## 2023-06-11 DIAGNOSIS — Z1231 Encounter for screening mammogram for malignant neoplasm of breast: Secondary | ICD-10-CM

## 2023-07-21 ENCOUNTER — Ambulatory Visit
Admission: RE | Admit: 2023-07-21 | Discharge: 2023-07-21 | Disposition: A | Discharge status: Home / Self Care | Payer: Self-pay | Source: Ambulatory Visit | Attending: Obstetrics and Gynecology | Admitting: Obstetrics and Gynecology

## 2023-07-21 DIAGNOSIS — Z1231 Encounter for screening mammogram for malignant neoplasm of breast: Secondary | ICD-10-CM | POA: Diagnosis present

## 2024-01-04 IMAGING — MG MM DIGITAL DIAGNOSTIC UNILAT*L* W/ TOMO W/ CAD
4 series · 4 of 12 positions shown · non-contrast
Comparison: Previous exam(s).

CLINICAL DATA: Recall from screening mammography, possible focal
asymmetry in the upper breast at posterior depth at the near 12
o'clock location.

EXAM:
DIGITAL DIAGNOSTIC UNILATERAL LEFT MAMMOGRAM WITH TOMOSYNTHESIS AND
CAD; ULTRASOUND LEFT BREAST LIMITED
TECHNIQUE: Left digital diagnostic mammography and breast tomosynthesis was
performed. The images were evaluated with computer-aided detection.;
Targeted ultrasound examination of the left breast was performed.

[L CC synth-2D]
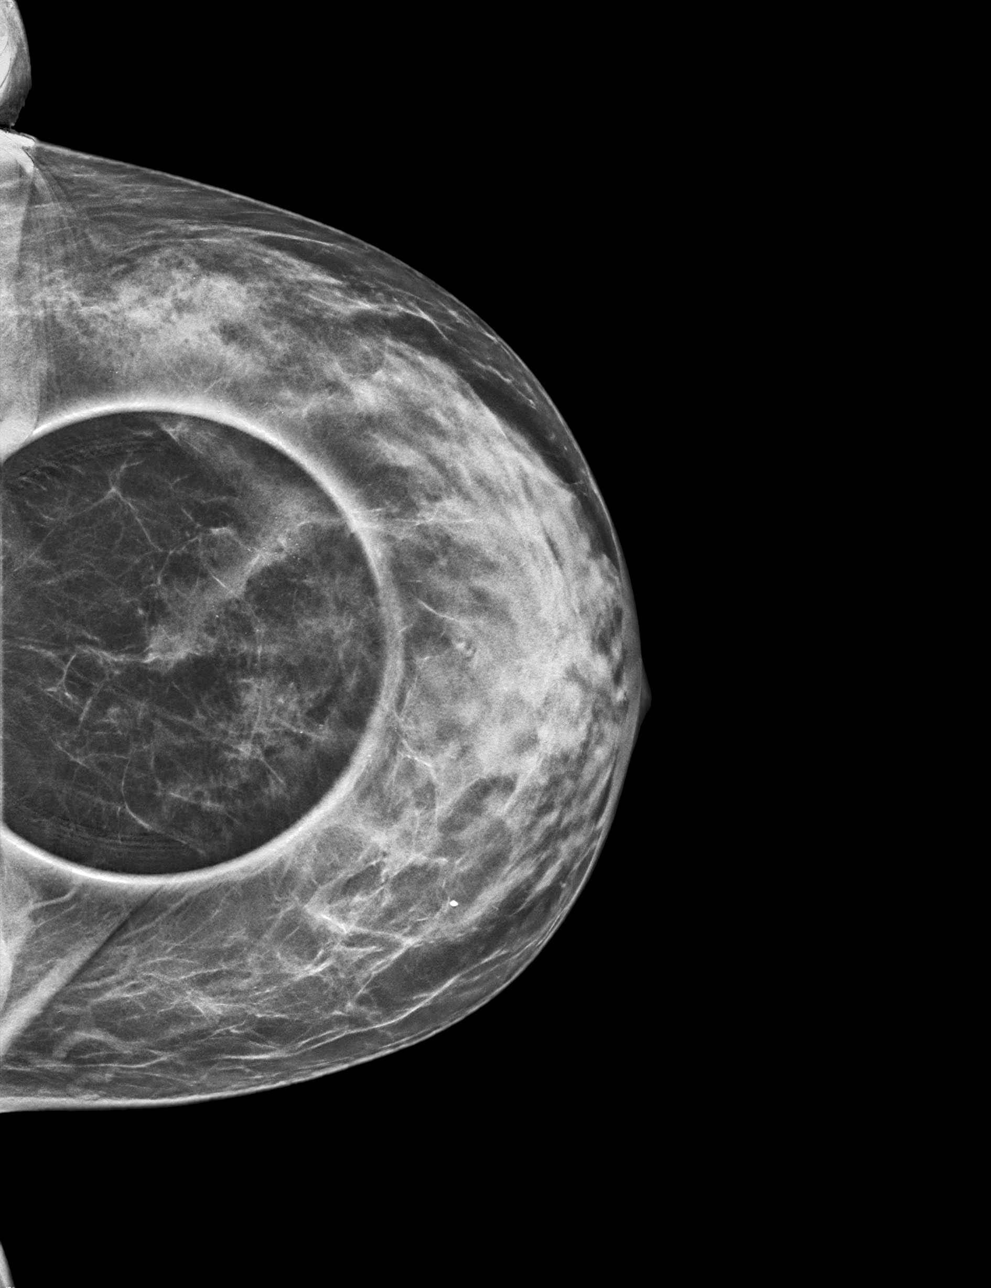

[L MLO synth-2D]
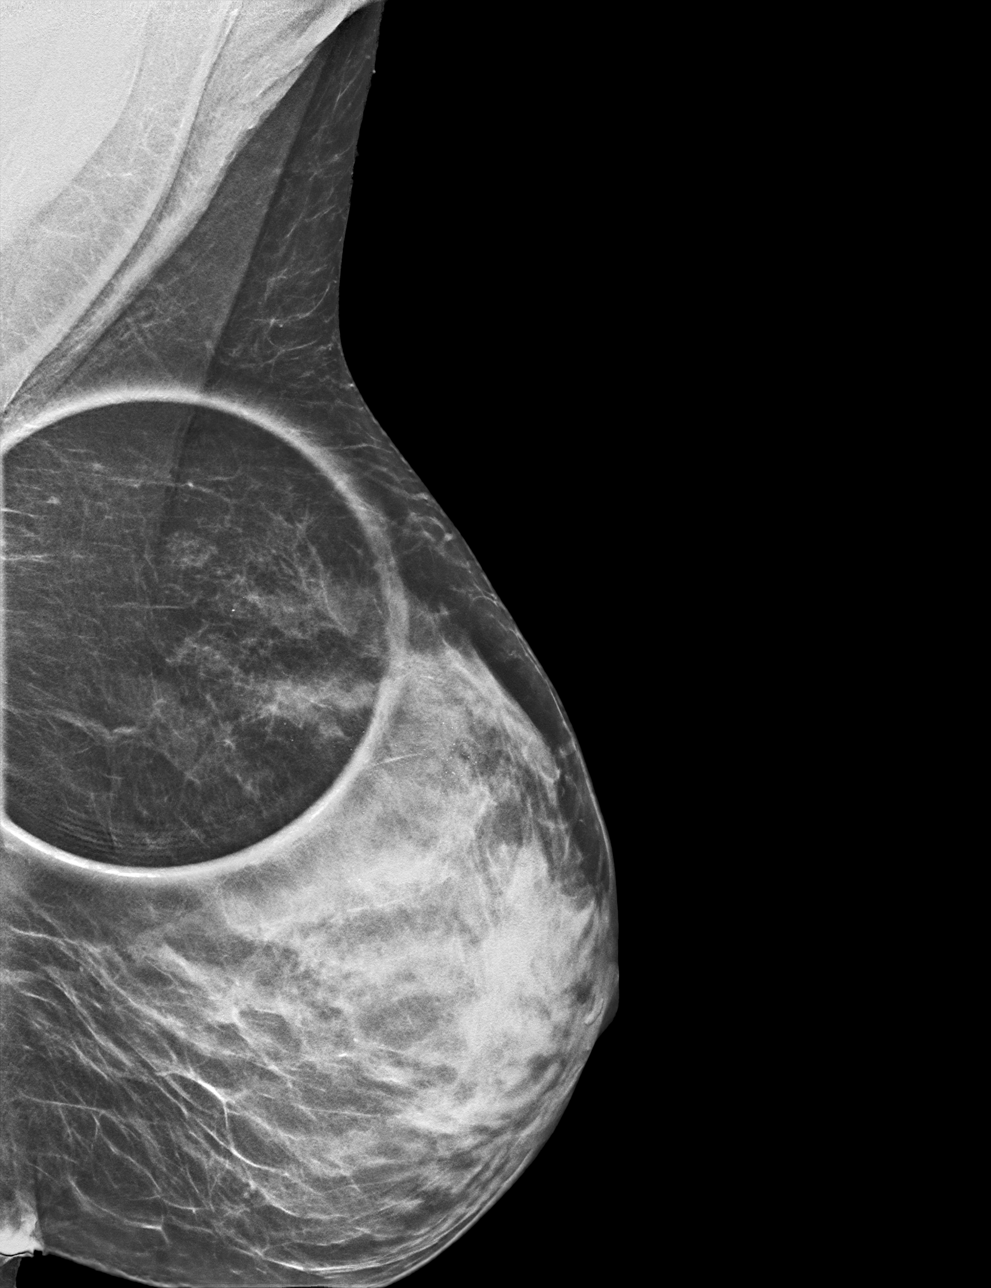

[L CC tomo · tomo slice 24/47.0]
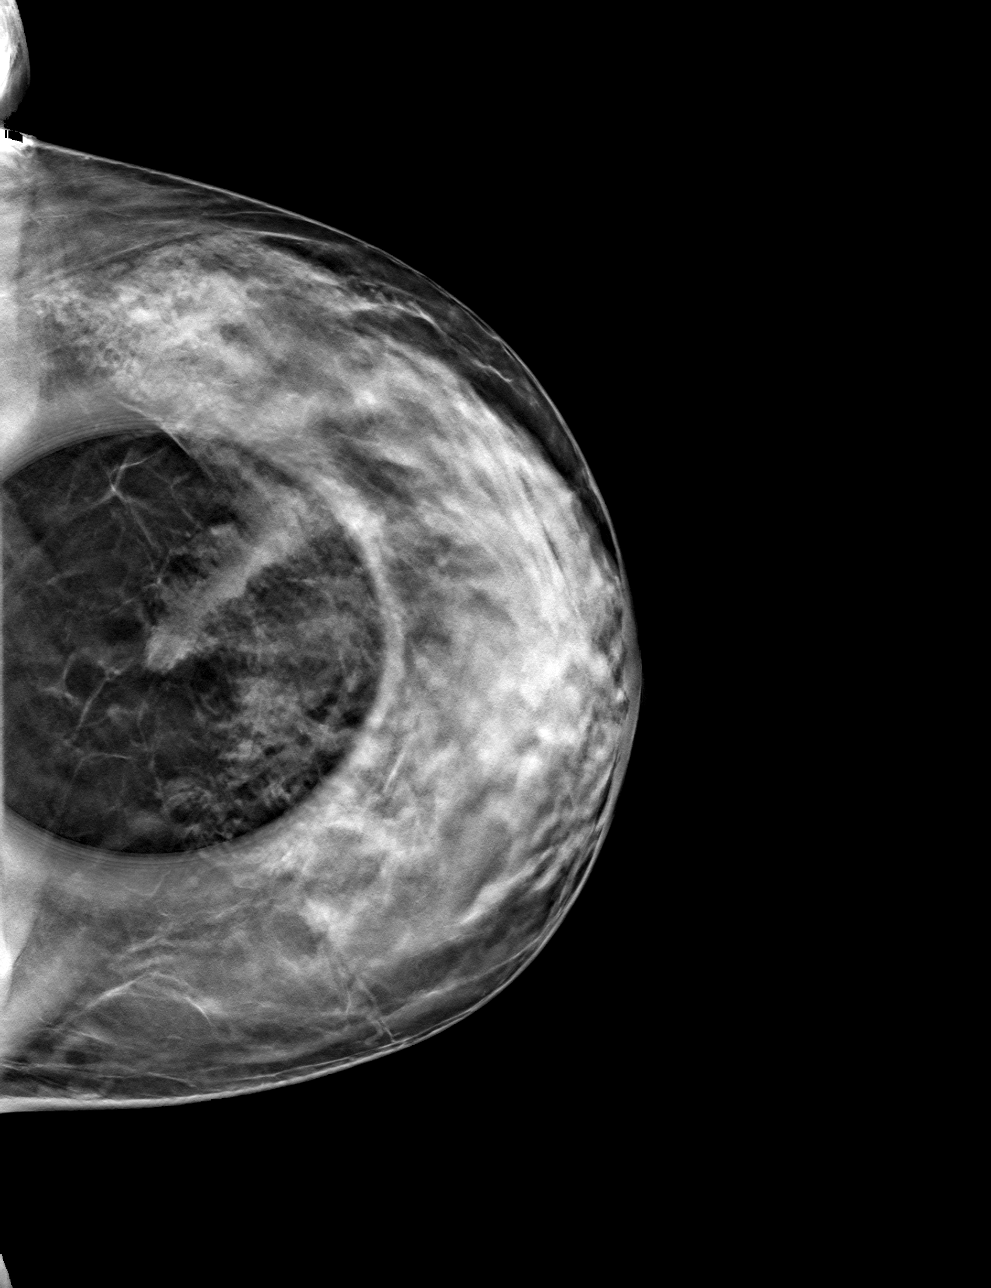

[L MLO tomo · tomo slice 25/50.0]
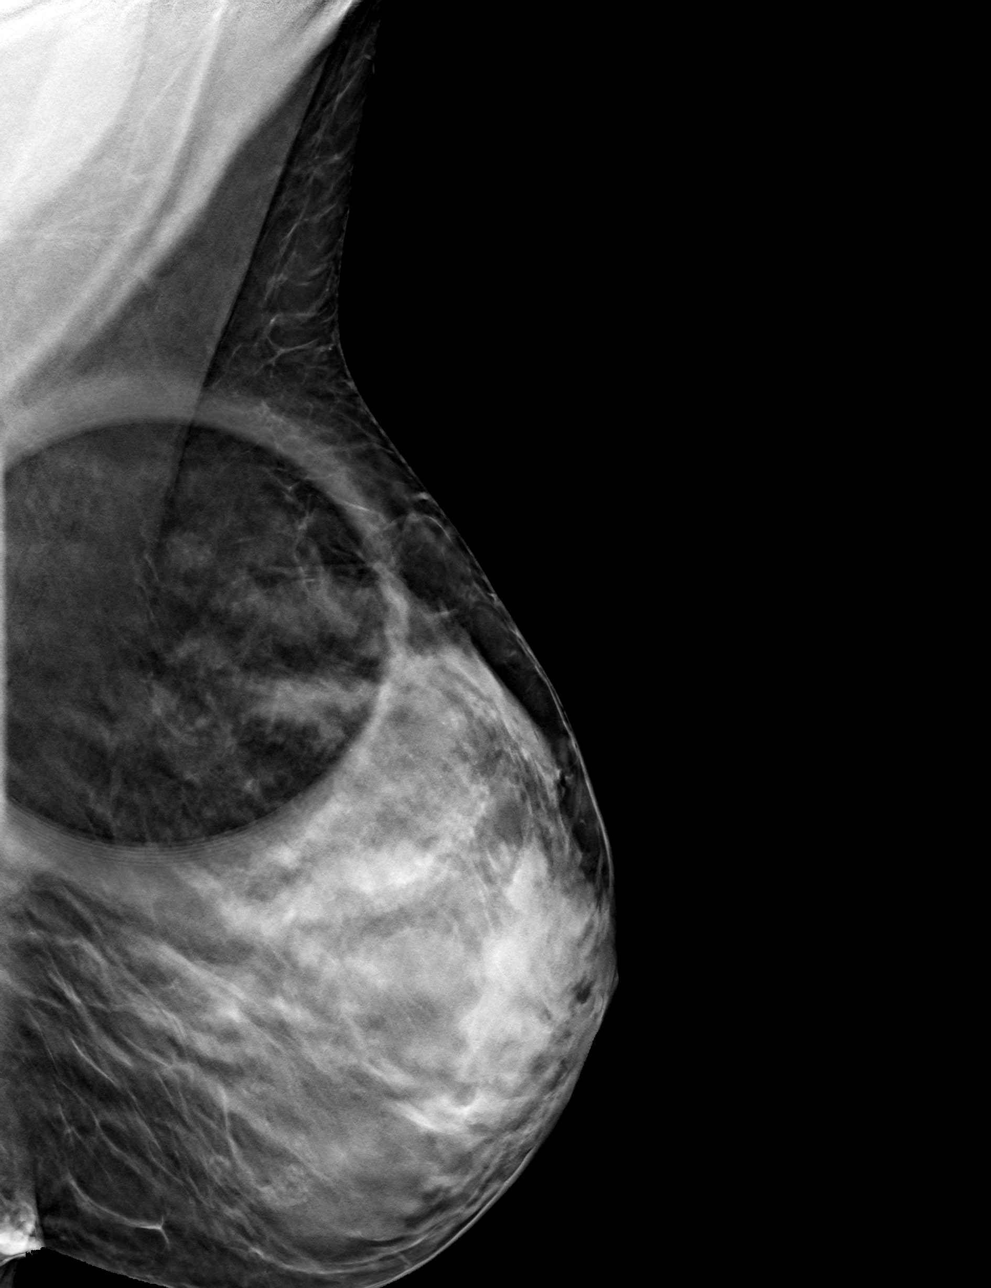

[4 of 12 positions shown; findings below may reference images not displayed]

ACR Breast Density Category c: The breast tissue is heterogeneously
dense, which may obscure small masses.
FINDINGS: Spot-compression CC and MLO views of the area of concern were
obtained.

The focal asymmetry in the upper breast at posterior depth, near 12
o'clock location, persists but partially disperses on the spot
compression view, likely indicating focally dense fibroglandular
tissue. There is no underlying mass or architectural distortion.

Targeted ultrasound is performed, demonstrating normal dense
fibroglandular tissue in the upper breast at the 12 o'clock
location. No cyst, solid mass or abnormal acoustic shadowing is
identified.
IMPRESSION: No mammographic or sonographic evidence of malignancy involving the
LEFT breast.

RECOMMENDATION:
Screening mammogram in one year.(Code:G6-R-L8S)

I have discussed the findings and recommendations with the patient.
If applicable, a reminder letter will be sent to the patient
regarding the next appointment.

BI-RADS CATEGORY  1: Negative.
